# Patient Record
Sex: Female | Born: 1975 | Race: Black or African American | Hispanic: No | Marital: Single | Smoking: Current every day smoker
Health system: Southern US, Community
[De-identification: ages and names within clinical notes are randomized; demographics above are authoritative.]

## PROBLEM LIST (undated history)

## (undated) DIAGNOSIS — F419 Anxiety disorder, unspecified: Secondary | ICD-10-CM

## (undated) HISTORY — PX: NO PAST SURGERIES: SHX2092

---

## 1999-01-24 ENCOUNTER — Encounter: Payer: Self-pay | Admitting: Internal Medicine

## 1999-01-24 ENCOUNTER — Emergency Department (HOSPITAL_COMMUNITY): Admission: EM | Admit: 1999-01-24 | Discharge: 1999-01-24 | Payer: Self-pay | Admitting: Internal Medicine

## 1999-12-29 ENCOUNTER — Emergency Department (HOSPITAL_COMMUNITY): Admission: EM | Admit: 1999-12-29 | Discharge: 1999-12-29 | Payer: Self-pay | Admitting: Emergency Medicine

## 2001-03-24 ENCOUNTER — Inpatient Hospital Stay (HOSPITAL_COMMUNITY): Admission: EM | Admit: 2001-03-24 | Discharge: 2001-03-28 | Payer: Self-pay | Admitting: *Deleted

## 2006-08-12 ENCOUNTER — Emergency Department (HOSPITAL_COMMUNITY): Admission: EM | Admit: 2006-08-12 | Discharge: 2006-08-12 | Payer: Self-pay | Admitting: Emergency Medicine

## 2012-06-10 DIAGNOSIS — F17200 Nicotine dependence, unspecified, uncomplicated: Secondary | ICD-10-CM | POA: Diagnosis not present

## 2012-06-10 DIAGNOSIS — M75100 Unspecified rotator cuff tear or rupture of unspecified shoulder, not specified as traumatic: Secondary | ICD-10-CM | POA: Diagnosis not present

## 2012-06-23 DIAGNOSIS — Z0000 Encounter for general adult medical examination without abnormal findings: Secondary | ICD-10-CM | POA: Diagnosis not present

## 2012-06-23 DIAGNOSIS — Z13220 Encounter for screening for lipoid disorders: Secondary | ICD-10-CM | POA: Diagnosis not present

## 2012-06-23 DIAGNOSIS — Z1231 Encounter for screening mammogram for malignant neoplasm of breast: Secondary | ICD-10-CM | POA: Diagnosis not present

## 2012-06-23 DIAGNOSIS — Z01419 Encounter for gynecological examination (general) (routine) without abnormal findings: Secondary | ICD-10-CM | POA: Diagnosis not present

## 2012-06-23 DIAGNOSIS — R636 Underweight: Secondary | ICD-10-CM | POA: Diagnosis not present

## 2012-06-23 DIAGNOSIS — Z131 Encounter for screening for diabetes mellitus: Secondary | ICD-10-CM | POA: Diagnosis not present

## 2012-06-23 DIAGNOSIS — E040 Nontoxic diffuse goiter: Secondary | ICD-10-CM | POA: Diagnosis not present

## 2012-06-25 DIAGNOSIS — M25819 Other specified joint disorders, unspecified shoulder: Secondary | ICD-10-CM | POA: Diagnosis not present

## 2012-06-25 DIAGNOSIS — M719 Bursopathy, unspecified: Secondary | ICD-10-CM | POA: Diagnosis not present

## 2012-06-25 DIAGNOSIS — M25519 Pain in unspecified shoulder: Secondary | ICD-10-CM | POA: Diagnosis not present

## 2012-12-17 DIAGNOSIS — F17200 Nicotine dependence, unspecified, uncomplicated: Secondary | ICD-10-CM | POA: Diagnosis not present

## 2012-12-17 DIAGNOSIS — R112 Nausea with vomiting, unspecified: Secondary | ICD-10-CM | POA: Diagnosis not present

## 2012-12-17 DIAGNOSIS — R1084 Generalized abdominal pain: Secondary | ICD-10-CM | POA: Diagnosis not present

## 2013-06-14 DIAGNOSIS — F17200 Nicotine dependence, unspecified, uncomplicated: Secondary | ICD-10-CM | POA: Diagnosis not present

## 2013-06-14 DIAGNOSIS — K047 Periapical abscess without sinus: Secondary | ICD-10-CM | POA: Diagnosis not present

## 2014-04-20 DIAGNOSIS — Z681 Body mass index (BMI) 19 or less, adult: Secondary | ICD-10-CM | POA: Diagnosis not present

## 2014-04-20 DIAGNOSIS — J029 Acute pharyngitis, unspecified: Secondary | ICD-10-CM | POA: Diagnosis not present

## 2014-08-23 DIAGNOSIS — R21 Rash and other nonspecific skin eruption: Secondary | ICD-10-CM | POA: Diagnosis not present

## 2014-08-23 DIAGNOSIS — L259 Unspecified contact dermatitis, unspecified cause: Secondary | ICD-10-CM | POA: Diagnosis not present

## 2014-09-16 DIAGNOSIS — R1084 Generalized abdominal pain: Secondary | ICD-10-CM | POA: Diagnosis not present

## 2014-09-16 DIAGNOSIS — K5289 Other specified noninfective gastroenteritis and colitis: Secondary | ICD-10-CM | POA: Diagnosis not present

## 2014-09-16 DIAGNOSIS — F17200 Nicotine dependence, unspecified, uncomplicated: Secondary | ICD-10-CM | POA: Diagnosis not present

## 2014-10-17 DIAGNOSIS — W010XXA Fall on same level from slipping, tripping and stumbling without subsequent striking against object, initial encounter: Secondary | ICD-10-CM | POA: Diagnosis not present

## 2014-10-17 DIAGNOSIS — F17210 Nicotine dependence, cigarettes, uncomplicated: Secondary | ICD-10-CM | POA: Diagnosis not present

## 2014-10-17 DIAGNOSIS — S93601A Unspecified sprain of right foot, initial encounter: Secondary | ICD-10-CM | POA: Diagnosis not present

## 2014-10-17 DIAGNOSIS — S93401A Sprain of unspecified ligament of right ankle, initial encounter: Secondary | ICD-10-CM | POA: Diagnosis not present

## 2014-10-17 DIAGNOSIS — S9000XA Contusion of unspecified ankle, initial encounter: Secondary | ICD-10-CM | POA: Diagnosis not present

## 2014-10-17 DIAGNOSIS — S9031XA Contusion of right foot, initial encounter: Secondary | ICD-10-CM | POA: Diagnosis not present

## 2014-10-20 DIAGNOSIS — F17200 Nicotine dependence, unspecified, uncomplicated: Secondary | ICD-10-CM | POA: Diagnosis not present

## 2014-10-20 DIAGNOSIS — Z681 Body mass index (BMI) 19 or less, adult: Secondary | ICD-10-CM | POA: Diagnosis not present

## 2014-10-20 DIAGNOSIS — S99929A Unspecified injury of unspecified foot, initial encounter: Secondary | ICD-10-CM | POA: Diagnosis not present

## 2015-01-01 DIAGNOSIS — R0989 Other specified symptoms and signs involving the circulatory and respiratory systems: Secondary | ICD-10-CM | POA: Diagnosis not present

## 2015-01-01 DIAGNOSIS — R112 Nausea with vomiting, unspecified: Secondary | ICD-10-CM | POA: Diagnosis not present

## 2015-01-01 DIAGNOSIS — R109 Unspecified abdominal pain: Secondary | ICD-10-CM | POA: Diagnosis not present

## 2015-01-01 DIAGNOSIS — K5900 Constipation, unspecified: Secondary | ICD-10-CM | POA: Diagnosis not present

## 2015-01-01 DIAGNOSIS — F17210 Nicotine dependence, cigarettes, uncomplicated: Secondary | ICD-10-CM | POA: Diagnosis not present

## 2015-01-01 DIAGNOSIS — R1084 Generalized abdominal pain: Secondary | ICD-10-CM | POA: Diagnosis not present

## 2015-01-01 DIAGNOSIS — R509 Fever, unspecified: Secondary | ICD-10-CM | POA: Diagnosis not present

## 2015-01-24 DIAGNOSIS — N951 Menopausal and female climacteric states: Secondary | ICD-10-CM | POA: Diagnosis not present

## 2015-01-24 DIAGNOSIS — Z1329 Encounter for screening for other suspected endocrine disorder: Secondary | ICD-10-CM | POA: Diagnosis not present

## 2015-01-24 DIAGNOSIS — R55 Syncope and collapse: Secondary | ICD-10-CM | POA: Diagnosis not present

## 2015-01-24 DIAGNOSIS — E049 Nontoxic goiter, unspecified: Secondary | ICD-10-CM | POA: Diagnosis not present

## 2015-01-31 DIAGNOSIS — E049 Nontoxic goiter, unspecified: Secondary | ICD-10-CM | POA: Diagnosis not present

## 2015-02-23 DIAGNOSIS — N951 Menopausal and female climacteric states: Secondary | ICD-10-CM | POA: Diagnosis not present

## 2015-02-23 DIAGNOSIS — E042 Nontoxic multinodular goiter: Secondary | ICD-10-CM | POA: Diagnosis not present

## 2015-02-23 DIAGNOSIS — Z23 Encounter for immunization: Secondary | ICD-10-CM | POA: Diagnosis not present

## 2015-02-23 DIAGNOSIS — Z681 Body mass index (BMI) 19 or less, adult: Secondary | ICD-10-CM | POA: Diagnosis not present

## 2015-03-17 DIAGNOSIS — E042 Nontoxic multinodular goiter: Secondary | ICD-10-CM | POA: Diagnosis not present

## 2015-03-17 DIAGNOSIS — E049 Nontoxic goiter, unspecified: Secondary | ICD-10-CM | POA: Diagnosis not present

## 2015-03-31 DIAGNOSIS — E042 Nontoxic multinodular goiter: Secondary | ICD-10-CM | POA: Diagnosis not present

## 2015-04-07 DIAGNOSIS — E042 Nontoxic multinodular goiter: Secondary | ICD-10-CM | POA: Diagnosis not present

## 2015-04-07 DIAGNOSIS — E049 Nontoxic goiter, unspecified: Secondary | ICD-10-CM | POA: Diagnosis not present

## 2015-10-05 DIAGNOSIS — M436 Torticollis: Secondary | ICD-10-CM | POA: Diagnosis not present

## 2016-05-12 DIAGNOSIS — R110 Nausea: Secondary | ICD-10-CM | POA: Diagnosis not present

## 2016-05-12 DIAGNOSIS — K226 Gastro-esophageal laceration-hemorrhage syndrome: Secondary | ICD-10-CM | POA: Diagnosis not present

## 2016-05-12 DIAGNOSIS — K920 Hematemesis: Secondary | ICD-10-CM | POA: Diagnosis not present

## 2016-05-12 DIAGNOSIS — R112 Nausea with vomiting, unspecified: Secondary | ICD-10-CM | POA: Diagnosis not present

## 2016-06-20 DIAGNOSIS — E049 Nontoxic goiter, unspecified: Secondary | ICD-10-CM | POA: Diagnosis not present

## 2016-06-20 DIAGNOSIS — R55 Syncope and collapse: Secondary | ICD-10-CM | POA: Diagnosis not present

## 2016-06-20 DIAGNOSIS — E860 Dehydration: Secondary | ICD-10-CM | POA: Diagnosis not present

## 2016-06-20 DIAGNOSIS — Z1389 Encounter for screening for other disorder: Secondary | ICD-10-CM | POA: Diagnosis not present

## 2016-06-20 DIAGNOSIS — Z0000 Encounter for general adult medical examination without abnormal findings: Secondary | ICD-10-CM | POA: Diagnosis not present

## 2016-06-20 DIAGNOSIS — Z681 Body mass index (BMI) 19 or less, adult: Secondary | ICD-10-CM | POA: Diagnosis not present

## 2016-06-20 DIAGNOSIS — Z9181 History of falling: Secondary | ICD-10-CM | POA: Diagnosis not present

## 2016-06-22 ENCOUNTER — Encounter (HOSPITAL_COMMUNITY): Payer: Self-pay | Admitting: *Deleted

## 2016-06-22 DIAGNOSIS — R55 Syncope and collapse: Secondary | ICD-10-CM | POA: Diagnosis not present

## 2016-06-22 DIAGNOSIS — E860 Dehydration: Secondary | ICD-10-CM | POA: Diagnosis not present

## 2016-06-22 DIAGNOSIS — Z5181 Encounter for therapeutic drug level monitoring: Secondary | ICD-10-CM | POA: Diagnosis not present

## 2016-06-22 NOTE — ED Notes (Signed)
The pt is c/o fainting in the br at work tonight.   Someone at work saw her having a seizure.  No bowel or bladder  Incontinence no tongue damage.  lmp now she saw her doctor Wednesday and family unhappy that more tests were not performed

## 2016-06-23 ENCOUNTER — Emergency Department (HOSPITAL_COMMUNITY)
Admission: EM | Admit: 2016-06-23 | Discharge: 2016-06-23 | Disposition: A | Discharge status: Home / Self Care | Payer: Medicare Other | Attending: Emergency Medicine | Admitting: Emergency Medicine

## 2016-06-23 DIAGNOSIS — R55 Syncope and collapse: Secondary | ICD-10-CM

## 2016-06-23 DIAGNOSIS — E860 Dehydration: Secondary | ICD-10-CM

## 2016-06-23 LAB — URINALYSIS, ROUTINE W REFLEX MICROSCOPIC
Bilirubin Urine: NEGATIVE
Glucose, UA: NEGATIVE mg/dL
KETONES UR: NEGATIVE mg/dL
LEUKOCYTES UA: NEGATIVE
Nitrite: NEGATIVE
PROTEIN: NEGATIVE mg/dL
Specific Gravity, Urine: 1.014 (ref 1.005–1.030)
pH: 6 (ref 5.0–8.0)

## 2016-06-23 LAB — BASIC METABOLIC PANEL
Anion gap: 4 — ABNORMAL LOW (ref 5–15)
CALCIUM: 9 mg/dL (ref 8.9–10.3)
CO2: 26 mmol/L (ref 22–32)
CREATININE: 0.59 mg/dL (ref 0.44–1.00)
Chloride: 107 mmol/L (ref 101–111)
GFR calc non Af Amer: >60 mL/min (ref >60)
Glucose, Bld: 91 mg/dL (ref 65–99)
Potassium: 3.9 mmol/L (ref 3.5–5.1)
SODIUM: 137 mmol/L (ref 135–145)

## 2016-06-23 LAB — RAPID URINE DRUG SCREEN, HOSP PERFORMED
Amphetamines: NOT DETECTED
BARBITURATES: NOT DETECTED
BENZODIAZEPINES: POSITIVE — AB
Cocaine: NOT DETECTED
Opiates: NOT DETECTED
Tetrahydrocannabinol: POSITIVE — AB

## 2016-06-23 LAB — URINE MICROSCOPIC-ADD ON

## 2016-06-23 LAB — CBC
HCT: 34.7 % — ABNORMAL LOW (ref 36.0–46.0)
Hemoglobin: 11.8 g/dL — ABNORMAL LOW (ref 12.0–15.0)
MCH: 26.8 pg (ref 26.0–34.0)
MCHC: 34 g/dL (ref 30.0–36.0)
MCV: 78.7 fL (ref 78.0–100.0)
PLATELETS: 245 10*3/uL (ref 150–400)
RBC: 4.41 MIL/uL (ref 3.87–5.11)
RDW: 16.2 % — AB (ref 11.5–15.5)
WBC: 8.3 10*3/uL (ref 4.0–10.5)

## 2016-06-23 LAB — CBG MONITORING, ED: GLUCOSE-CAPILLARY: 87 mg/dL (ref 65–99)

## 2016-06-23 LAB — TSH: TSH: 1.649 u[IU]/mL (ref 0.350–4.500)

## 2016-06-23 LAB — PREGNANCY, URINE: Preg Test, Ur: NEGATIVE

## 2016-06-23 MED ORDER — SODIUM CHLORIDE 0.9 % IV BOLUS (SEPSIS)
1000.0000 mL | Freq: Once | INTRAVENOUS | Status: AC
  Administered 2016-06-23: 1000 mL via INTRAVENOUS

## 2016-06-23 NOTE — ED Provider Notes (Signed)
CSN: [id number]     Arrival date & time 06/22/16  2337 History   First MD Initiated Contact with Patient 06/23/16 0041     Chief Complaint  Patient presents with  . Loss of Consciousness     (Consider location/radiation/quality/duration/timing/severity/associated sxs/prior Treatment) The history is provided by the patient.     Patient presents following episode of syncope.  Per pt and friends in the room pt has had at least 3 episodes of syncope in the past few weeks.  Pt has felt generalized weakness x several weeks.  Was seen by PCP for same and was found to be very dehydrated.  Pt reportedly does not eat regularly and drinks very little.  She keeps candy with her to eat when she gets too weak.  Today she was walking to the bathroom at work and lost consciousness.  She felt only generalized weakness before this occurred.  She did have mild headache following fall that has resolved.  Friend in room witnessed episode approximately 6 days ago in which the patient was at the grocery store, slumped over the cart and then fell to the floor, still talking and holding onto the shopping cart.  She "jerked" a few times but was awake while this occurred.  She never had episodes of significant shaking or jerking and was never post ictal.   Pt has hx benign thyroid nodules that are being monitored.  Denies any other symptoms.  Takes no medications.   Denies family hx CAD, sudden cardiac or unexplained death.  Denies depression.    PCP Abner GreenspanBeth Hodges, [place]   History reviewed. No pertinent past medical history. History reviewed. No pertinent past surgical history. No family history on file. Social History  Substance Use Topics  . Smoking status: Never Smoker   . Smokeless tobacco: None  . Alcohol Use: No   OB History    No data available     Review of Systems  All other systems reviewed and are negative.     Allergies  Penicillins  Home Medications   Prior to Admission medications   Not  on File   BP 115/73 mmHg  Pulse 72  Temp(Src) 98.4 F (36.9 C) (Oral)  Resp 15  Ht 5\' 7"  (1.702 m)  Wt 51.71 kg  BMI 17.85 kg/m2  SpO2 100%  LMP 06/22/2016 Physical Exam  Constitutional: She appears well-developed. No distress.  thin  HENT:  Head: Normocephalic and atraumatic.  Neck: Neck supple.  Cardiovascular: Normal rate and regular rhythm.   Pulmonary/Chest: Effort normal and breath sounds normal. No respiratory distress. She has no wheezes. She has no rales.  Abdominal: Soft. She exhibits no distension. There is no tenderness. There is no rebound and no guarding.  Neurological: She is alert. She exhibits normal muscle tone.  Skin: She is not diaphoretic.  Psychiatric: She has a normal mood and affect. Her behavior is normal.  Nursing note and vitals reviewed.   ED Course  Procedures (including critical care time) Labs Review Labs Reviewed  BASIC METABOLIC PANEL - Abnormal; Notable for the following:    BUN <5 (*)    Anion gap 4 (*)    All other components within normal limits  CBC - Abnormal; Notable for the following:    Hemoglobin 11.8 (*)    HCT 34.7 (*)    RDW 16.2 (*)    All other components within normal limits  URINALYSIS, ROUTINE W REFLEX MICROSCOPIC (NOT AT [facility]) - Abnormal; Notable for the following:  APPearance CLOUDY (*)    Hgb urine dipstick MODERATE (*)    All other components within normal limits  URINE RAPID DRUG SCREEN, HOSP PERFORMED - Abnormal; Notable for the following:    Benzodiazepines POSITIVE (*)    Tetrahydrocannabinol POSITIVE (*)    All other components within normal limits  URINE MICROSCOPIC-ADD ON - Abnormal; Notable for the following:    Squamous Epithelial / LPF 6-30 (*)    Bacteria, UA FEW (*)    All other components within normal limits  TSH  PREGNANCY, URINE  CBG MONITORING, ED  POC URINE PREG, ED    Imaging Review No results found. I have personally reviewed and evaluated these images and lab results as part of my  medical decision-making.   EKG Interpretation   Date/Time:  Friday June 22 2016 23:51:30 EDT Ventricular Rate:  86 PR Interval:  120 QRS Duration: 78 QT Interval:  376 QTC Calculation: 449 R Axis:   97 Text Interpretation:  Normal sinus rhythm Possible Left atrial enlargement  Rightward axis Borderline ECG No previous ECGs available Confirmed by  Bebe ShaggyWICKLINE  MD, Dorinda HillNALD ([id number]) on 06/23/2016 12:54:29 AM      MDM   Final diagnoses:  Syncope, unspecified syncope type  Dehydration    Afebrile, nontoxic patient with 3 episodes of syncope or near syncope over the past few weeks.  She has been feeling generally weak, has been found to be severely dehydrated by primary care.  She is very thin and reportedly does not eat regularly or drink many fluids during the day.  Workup reassuring.  [place] syncope rules consulted, pt low risk.  Pt feeling much better after IVF, PO trial.   D/C home with close PCP follow up, encouraged PO hydration at home.  Discussed result, findings, treatment, and follow up  with patient.  Pt given return precautions.  Pt verbalizes understanding and agrees with plan.         GlenvilleEmily Braylin Xu, PA-C 06/23/16 [id number]  Zadie Rhineonald Wickline, MD 06/23/16 [phone]

## 2016-06-23 NOTE — Discharge Instructions (Signed)
Read the information below.  You may return to the Emergency Department at any time for worsening condition or any new symptoms that concern you.   Syncope Syncope is a medical term for fainting or passing out. This means you lose consciousness and drop to the ground. People are generally unconscious for less than 5 minutes. You may have some muscle twitches for up to 15 seconds before waking up and returning to normal. Syncope occurs more often in older adults, but it can happen to anyone. While most causes of syncope are not dangerous, syncope can be a sign of a serious medical problem. It is important to seek medical care.  CAUSES  Syncope is caused by a sudden drop in blood flow to the brain. The specific cause is often not determined. Factors that can bring on syncope include:  Taking medicines that lower blood pressure.  Sudden changes in posture, such as standing up quickly.  Taking more medicine than prescribed.  Standing in one place for too long.  Seizure disorders.  Dehydration and excessive exposure to heat.  Low blood sugar (hypoglycemia).  Straining to have a bowel movement.  Heart disease, irregular heartbeat, or other circulatory problems.  Fear, emotional distress, seeing blood, or severe pain. SYMPTOMS  Right before fainting, you may:  Feel dizzy or light-headed.  Feel nauseous.  See all white or all black in your field of vision.  Have cold, clammy skin. DIAGNOSIS  Your health care provider will ask about your symptoms, perform a physical exam, and perform an electrocardiogram (ECG) to record the electrical activity of your heart. Your health care provider may also perform other heart or blood tests to determine the cause of your syncope which may include:  Transthoracic echocardiogram (TTE). During echocardiography, sound waves are used to evaluate how blood flows through your heart.  Transesophageal echocardiogram (TEE).  Cardiac monitoring. This  allows your health care provider to monitor your heart rate and rhythm in real time.  Holter monitor. This is a portable device that records your heartbeat and can help diagnose heart arrhythmias. It allows your health care provider to track your heart activity for several days, if needed.  Stress tests by exercise or by giving medicine that makes the heart beat faster. TREATMENT  In most cases, no treatment is needed. Depending on the cause of your syncope, your health care provider may recommend changing or stopping some of your medicines. HOME CARE INSTRUCTIONS  Have someone stay with you until you feel stable.  Do not drive, use machinery, or play sports until your health care provider says it is okay.  Keep all follow-up appointments as directed by your health care provider.  Lie down right away if you start feeling like you might faint. Breathe deeply and steadily. Wait until all the symptoms have passed.  Drink enough fluids to keep your urine clear or pale yellow.  If you are taking blood pressure or heart medicine, get up slowly and take several minutes to sit and then stand. This can reduce dizziness. SEEK IMMEDIATE MEDICAL CARE IF:   You have a severe headache.  You have unusual pain in the chest, abdomen, or back.  You are bleeding from your mouth or rectum, or you have black or tarry stool.  You have an irregular or very fast heartbeat.  You have pain with breathing.  You have repeated fainting or seizure-like jerking during an episode.  You faint when sitting or lying down.  You have confusion.  You  have trouble walking.  You have severe weakness.  You have vision problems. If you fainted, call your local emergency services (911 in [place] Do not drive yourself to the hospital.    This information is not intended to replace advice given to you by your health care provider. Make sure you discuss any questions you have with your health care provider.     Document Released: 12/10/2005 Document Revised: 04/26/2015 Document Reviewed: 02/08/2012 Elsevier Interactive Patient Education 2016 Elsevier Inc.  Dehydration, Adult Dehydration is a condition in which you do not have enough fluid or water in your body. It happens when you take in less fluid than you lose. Vital organs such as the kidneys, brain, and heart cannot function without a proper amount of fluids. Any loss of fluids from the body can cause dehydration.  Dehydration can range from mild to severe. This condition should be treated right away to help prevent it from becoming severe. CAUSES  This condition may be caused by:  Vomiting.  Diarrhea.  Excessive sweating, such as when exercising in hot or humid weather.  Not drinking enough fluid during strenuous exercise or during an illness.  Excessive urine output.  Fever.  Certain medicines. RISK FACTORS This condition is more likely to develop in:  People who are taking certain medicines that cause the body to lose excess fluid (diuretics).   People who have a chronic illness, such as diabetes, that may increase urination.  Older adults.   People who live at high altitudes.   People who participate in endurance sports.  SYMPTOMS  Mild Dehydration  Thirst.  Dry lips.  Slightly dry mouth.  Dry, warm skin. Moderate Dehydration  Very dry mouth.   Muscle cramps.   Dark urine and decreased urine production.   Decreased tear production.   Headache.   Light-headedness, especially when you stand up from a sitting position.  Severe Dehydration  Changes in skin.   Cold and clammy skin.   Skin does not spring back quickly when lightly pinched and released.   Changes in body fluids.   Extreme thirst.   No tears.   Not able to sweat when body temperature is high, such as in hot weather.   Minimal urine production.   Changes in vital signs.   Rapid, weak pulse (more than 100  beats per minute when you are sitting still).   Rapid breathing.   Low blood pressure.   Other changes.   Sunken eyes.   Cold hands and feet.   Confusion.  Lethargy and difficulty being awakened.  Fainting (syncope).   Short-term weight loss.   Unconsciousness. DIAGNOSIS  This condition may be diagnosed based on your symptoms. You may also have tests to determine how severe your dehydration is. These tests may include:   Urine tests.   Blood tests.  TREATMENT  Treatment for this condition depends on the severity. Mild or moderate dehydration can often be treated at home. Treatment should be started right away. Do not wait until dehydration becomes severe. Severe dehydration needs to be treated at the hospital. Treatment for Mild Dehydration  Drinking plenty of water to replace the fluid you have lost.   Replacing minerals in your blood (electrolytes) that you may have lost.  Treatment for Moderate Dehydration  Consuming oral rehydration solution (ORS). Treatment for Severe Dehydration  Receiving fluid through an IV tube.   Receiving electrolyte solution through a feeding tube that is passed through your nose and into your stomach (nasogastric  tube or NG tube).  Correcting any abnormalities in electrolytes. HOME CARE INSTRUCTIONS   Drink enough fluid to keep your urine clear or pale yellow.   Drink water or fluid slowly by taking small sips. You can also try sucking on ice cubes.  Have food or beverages that contain electrolytes. Examples include bananas and sports drinks.  Take over-the-counter and prescription medicines only as told by your health care provider.   Prepare ORS according to the manufacturer's instructions. Take sips of ORS every 5 minutes until your urine returns to normal.  If you have vomiting or diarrhea, continue to try to drink water, ORS, or both.   If you have diarrhea, avoid:   Beverages that contain caffeine.    Fruit juice.   Milk.   Carbonated soft drinks.  Do not take salt tablets. This can lead to the condition of having too much sodium in your body (hypernatremia).  SEEK MEDICAL CARE IF:  You cannot eat or drink without vomiting.  You have had moderate diarrhea during a period of more than 24 hours.  You have a fever. SEEK IMMEDIATE MEDICAL CARE IF:   You have extreme thirst.  You have severe diarrhea.  You have not urinated in 6-8 hours, or you have urinated only a small amount of very dark urine.  You have shriveled skin.  You are dizzy, confused, or both.   This information is not intended to replace advice given to you by your health care provider. Make sure you discuss any questions you have with your health care provider.   Document Released: 12/10/2005 Document Revised: 08/31/2015 Document Reviewed: 04/27/2015 Elsevier Interactive Patient Education [employer]2016 Elsevier Inc.

## 2016-06-23 NOTE — ED Notes (Signed)
Pt given orange juice and sandwich 

## 2016-10-25 DIAGNOSIS — R110 Nausea: Secondary | ICD-10-CM | POA: Diagnosis not present

## 2016-10-25 DIAGNOSIS — N12 Tubulo-interstitial nephritis, not specified as acute or chronic: Secondary | ICD-10-CM | POA: Diagnosis not present

## 2016-10-25 DIAGNOSIS — Z880 Allergy status to penicillin: Secondary | ICD-10-CM | POA: Diagnosis not present

## 2016-10-25 DIAGNOSIS — Z681 Body mass index (BMI) 19 or less, adult: Secondary | ICD-10-CM | POA: Diagnosis not present

## 2016-10-25 DIAGNOSIS — R109 Unspecified abdominal pain: Secondary | ICD-10-CM | POA: Diagnosis not present

## 2016-10-25 DIAGNOSIS — R6883 Chills (without fever): Secondary | ICD-10-CM | POA: Diagnosis not present

## 2016-10-25 DIAGNOSIS — F17210 Nicotine dependence, cigarettes, uncomplicated: Secondary | ICD-10-CM | POA: Diagnosis not present

## 2016-10-29 ENCOUNTER — Emergency Department (HOSPITAL_COMMUNITY): Payer: Medicare Other

## 2016-10-29 ENCOUNTER — Encounter (HOSPITAL_COMMUNITY): Payer: Self-pay

## 2016-10-29 ENCOUNTER — Emergency Department (HOSPITAL_COMMUNITY)
Admission: EM | Admit: 2016-10-29 | Discharge: 2016-10-29 | Disposition: A | Discharge status: Home / Self Care | Payer: Medicare Other | Attending: Emergency Medicine | Admitting: Emergency Medicine

## 2016-10-29 DIAGNOSIS — R109 Unspecified abdominal pain: Secondary | ICD-10-CM | POA: Diagnosis not present

## 2016-10-29 DIAGNOSIS — Z681 Body mass index (BMI) 19 or less, adult: Secondary | ICD-10-CM | POA: Diagnosis not present

## 2016-10-29 DIAGNOSIS — N12 Tubulo-interstitial nephritis, not specified as acute or chronic: Secondary | ICD-10-CM | POA: Diagnosis not present

## 2016-10-29 DIAGNOSIS — F17200 Nicotine dependence, unspecified, uncomplicated: Secondary | ICD-10-CM | POA: Insufficient documentation

## 2016-10-29 DIAGNOSIS — N939 Abnormal uterine and vaginal bleeding, unspecified: Secondary | ICD-10-CM | POA: Diagnosis not present

## 2016-10-29 DIAGNOSIS — K8020 Calculus of gallbladder without cholecystitis without obstruction: Secondary | ICD-10-CM | POA: Diagnosis not present

## 2016-10-29 DIAGNOSIS — R319 Hematuria, unspecified: Secondary | ICD-10-CM | POA: Diagnosis not present

## 2016-10-29 LAB — URINALYSIS, ROUTINE W REFLEX MICROSCOPIC
Bilirubin Urine: NEGATIVE
Glucose, UA: NEGATIVE mg/dL
KETONES UR: NEGATIVE mg/dL
Nitrite: NEGATIVE
PROTEIN: 30 mg/dL — AB
Specific Gravity, Urine: 1.017 (ref 1.005–1.030)
pH: 7.5 (ref 5.0–8.0)

## 2016-10-29 LAB — URINE MICROSCOPIC-ADD ON

## 2016-10-29 LAB — PREGNANCY, URINE: Preg Test, Ur: NEGATIVE

## 2016-10-29 MED ORDER — SODIUM CHLORIDE 0.9 % IV BOLUS (SEPSIS)
1000.0000 mL | Freq: Once | INTRAVENOUS | Status: AC
  Administered 2016-10-29: 1000 mL via INTRAVENOUS

## 2016-10-29 MED ORDER — KETOROLAC TROMETHAMINE 30 MG/ML IJ SOLN
30.0000 mg | Freq: Once | INTRAMUSCULAR | Status: AC
  Administered 2016-10-29: 30 mg via INTRAVENOUS
  Filled 2016-10-29: qty 1

## 2016-10-29 MED ORDER — ONDANSETRON 4 MG PO TBDP: 4.0000 mg | ORAL_TABLET | Freq: Three times a day (TID) | ORAL | 0 refills | Status: AC | PRN

## 2016-10-29 MED ORDER — HYDROCODONE-ACETAMINOPHEN 5-325 MG PO TABS: 2.0000 | ORAL_TABLET | ORAL | 0 refills | Status: AC | PRN

## 2016-10-29 MED ORDER — DEXTROSE 5 % IV SOLN
1.0000 g | Freq: Once | INTRAVENOUS | Status: AC
  Administered 2016-10-29: 1 g via INTRAVENOUS
  Filled 2016-10-29: qty 10

## 2016-10-29 MED ORDER — CIPROFLOXACIN HCL 500 MG PO TABS: 500.0000 mg | ORAL_TABLET | Freq: Two times a day (BID) | ORAL | 0 refills | Status: AC

## 2016-10-29 NOTE — ED Triage Notes (Signed)
Pt states sent here by PCP for CT scan to rule out kidney stones. Pt complaining of bilateral flank pain. Pt denies any urinary symptoms.

## 2016-10-29 NOTE — Discharge Instructions (Signed)
Follow-up with your primary care physician if not improving.  Start new Rx for Cipro, discontinue your prior antibiotic

## 2016-10-29 NOTE — ED Notes (Signed)
Pt returned from CT °

## 2016-10-29 NOTE — ED Provider Notes (Signed)
[facility]-EMERGENCY DEPT Provider Note   CSN: [id number] Arrival date & time: 10/29/16  1505     History   Chief Complaint Chief Complaint  Patient presents with  . Flank Pain    HPI Shelley Bird is a 40 y.o. female. She presents for evaluation of flank pain. Symptoms started Thursday, 5 days ago. Started with body aches and fever and bilateral flank pain. Seen by her physician and started on an antibiotic and told she had urinary tract infection. She's not improved. Continues having flank pain. Seen and evaluated again by her physician today with worsening flank pain and sent here to rule out kidney stone. No history of stones.  HPI  History reviewed. No pertinent past medical history.  There are no active problems to display for this patient.   History reviewed. No pertinent surgical history.  OB History    No data available       Home Medications    Prior to Admission medications   Medication Sig Start Date End Date Taking? Authorizing Provider  ciprofloxacin (CIPRO) 500 MG tablet Take 1 tablet (500 mg total) by mouth every 12 (twelve) hours. 10/29/16   Rolland Porter, MD  HYDROcodone-acetaminophen (NORCO/VICODIN) 5-325 MG tablet Take 2 tablets by mouth every 4 (four) hours as needed. 10/29/16   Rolland Porter, MD  ondansetron (ZOFRAN ODT) 4 MG disintegrating tablet Take 1 tablet (4 mg total) by mouth every 8 (eight) hours as needed for nausea. 10/29/16   Rolland Porter, MD    Family History History reviewed. No pertinent family history.  Social History Social History  Substance Use Topics  . Smoking status: Current Every Day Smoker  . Smokeless tobacco: Never Used  . Alcohol use No     Allergies   Penicillins   Review of Systems Review of Systems  Constitutional: Positive for fatigue and fever. Negative for appetite change, chills and diaphoresis.  HENT: Negative for mouth sores, sore throat and trouble swallowing.   Eyes: Negative for visual disturbance.    Respiratory: Negative for cough, chest tightness, shortness of breath and wheezing.   Cardiovascular: Negative for chest pain.  Gastrointestinal: Negative for abdominal distention, abdominal pain, diarrhea, nausea and vomiting.  Endocrine: Negative for polydipsia, polyphagia and polyuria.  Genitourinary: Positive for flank pain. Negative for decreased urine volume, dysuria, frequency, hematuria, vaginal bleeding, vaginal discharge and vaginal pain.  Musculoskeletal: Negative for gait problem.  Skin: Negative for color change, pallor and rash.  Neurological: Negative for dizziness, syncope, light-headedness and headaches.  Hematological: Does not bruise/bleed easily.  Psychiatric/Behavioral: Negative for behavioral problems and confusion.     Physical Exam Updated Vital Signs BP 110/65 (BP Location: Left Arm)   Pulse 84   Temp 98.1 F (36.7 C) (Oral)   Resp 17   Ht 5\' 7"  (1.702 m)   Wt 115 lb (52.2 kg)   LMP 10/29/2016   SpO2 100%   BMI 18.01 kg/m   Physical Exam  Constitutional: She is oriented to person, place, and time. She appears well-developed and well-nourished. No distress.  HENT:  Head: Normocephalic.  Eyes: Conjunctivae are normal. Pupils are equal, round, and reactive to light. No scleral icterus.  Neck: Normal range of motion. Neck supple. No thyromegaly present.  Cardiovascular: Normal rate and regular rhythm.  Exam reveals no gallop and no friction rub.   No murmur heard. Pulmonary/Chest: Effort normal and breath sounds normal. No respiratory distress. She has no wheezes. She has no rales.  Abdominal: Soft. Bowel sounds are  normal. She exhibits no distension. There is no tenderness. There is no rebound.  Musculoskeletal: Normal range of motion.       Back:  Diffuse muscle skeletal tenderness. Not specifically isolated to left or right CVA.  Neurological: She is alert and oriented to person, place, and time.  Skin: Skin is warm and dry. No rash noted.   Psychiatric: She has a normal mood and affect. Her behavior is normal.     ED Treatments / Results  Labs (all labs ordered are listed, but only abnormal results are displayed) Labs Reviewed  URINALYSIS, ROUTINE W REFLEX MICROSCOPIC (NOT AT [facility]) - Abnormal; Notable for the following:       Result Value   Hgb urine dipstick SMALL (*)    Protein, ur 30 (*)    Leukocytes, UA SMALL (*)    All other components within normal limits  URINE MICROSCOPIC-ADD ON - Abnormal; Notable for the following:    Squamous Epithelial / LPF 0-5 (*)    Bacteria, UA MANY (*)    All other components within normal limits  PREGNANCY, URINE    EKG  EKG Interpretation None       Radiology Ct Renal Stone Study  Result Date: 10/29/2016 CLINICAL DATA:  Bilateral flank pain and back pain with dysuria for the past 4 days. EXAM: CT ABDOMEN AND PELVIS WITHOUT CONTRAST TECHNIQUE: Multidetector CT imaging of the abdomen and pelvis was performed following the standard protocol without IV contrast. COMPARISON:  None. FINDINGS: Lower chest: Minimal bibasilar dependent atelectasis. Hepatobiliary: Multiple small gallstones in the gallbladder measuring up to 3 mm in maximum diameter each. Poor distention of the gallbladder with minimal diffuse wall thickening. No pericholecystic fluid visualized. Normal appearing liver. Pancreas: Unremarkable. No pancreatic ductal dilatation or surrounding inflammatory changes. Spleen: Normal in size without focal abnormality. Adrenals/Urinary Tract: Adrenal glands are unremarkable. Kidneys are normal, without renal calculi, focal lesion, or hydronephrosis. Bladder is unremarkable. Stomach/Bowel: Stomach is within normal limits. Appendix appears normal. No evidence of bowel wall thickening, distention, or inflammatory changes. Vascular/Lymphatic: No significant vascular findings are present. No enlarged abdominal or pelvic lymph nodes. Reproductive: Large, grossly simple appearing left ovarian  cyst measuring 7.5 cm in maximum diameter. Small amount of free peritoneal fluid in the pelvic cul-de-sac. Normal appearing uterus and right ovary. Other: None. Musculoskeletal: Minimal lumbar and lower thoracic spine degenerative changes. IMPRESSION: 1. Cholelithiasis without evidence of acute cholecystitis. 2. 7.5 cm left ovarian cyst. A elective follow-up pelvic ultrasound is recommended for better characterization. 3. No visible urinary tract abnormality. Electronically Signed   By: Beckie SaltsSteven  Reid M.D.   On: 10/29/2016 19:11    Procedures Procedures (including critical care time)  Medications Ordered in ED Medications  ketorolac (TORADOL) 30 MG/ML injection 30 mg (30 mg Intravenous Given 10/29/16 1808)  cefTRIAXone (ROCEPHIN) 1 g in dextrose 5 % 50 mL IVPB (1 g Intravenous New Bag/Given 10/29/16 1808)  sodium chloride 0.9 % bolus 1,000 mL (1,000 mLs Intravenous New Bag/Given 10/29/16 1808)     Initial Impression / Assessment and Plan / ED Course  I have reviewed the triage vital signs and the nursing notes.  Pertinent labs & imaging results that were available during my care of the patient were reviewed by me and considered in my medical decision making (see chart for details).  Clinical Course     IV placed. Given IV Rocephin. Toradol. Off to CT for stone CT to rule out renal stone.  Final Clinical Impressions(s) / ED Diagnoses  Final diagnoses:  Pyelonephritis    Patient with gallstone. Is not symptomatic with biliary colic symptoms. Has left ovarian cyst. Made aware of this. She does not have specific left pelvic tenderness. Discussed she should follow-up with her primary care physician regarding this. Her symptoms today are consistent with pyelonephritis. We'll change to Cipro. Urine culture added. Plan home, Zofran, Cipro, Vicodin. Primary care follow-up  New Prescriptions New Prescriptions   CIPROFLOXACIN (CIPRO) 500 MG TABLET    Take 1 tablet (500 mg total) by mouth every 12  (twelve) hours.   HYDROCODONE-ACETAMINOPHEN (NORCO/VICODIN) 5-325 MG TABLET    Take 2 tablets by mouth every 4 (four) hours as needed.   ONDANSETRON (ZOFRAN ODT) 4 MG DISINTEGRATING TABLET    Take 1 tablet (4 mg total) by mouth every 8 (eight) hours as needed for nausea.     Rolland PorterMark Mehr Depaoli, MD 10/29/16 [phone]

## 2016-10-29 NOTE — ED Notes (Signed)
Pt taken to CT.

## 2016-11-01 DIAGNOSIS — N390 Urinary tract infection, site not specified: Secondary | ICD-10-CM | POA: Diagnosis not present

## 2016-11-01 DIAGNOSIS — F17200 Nicotine dependence, unspecified, uncomplicated: Secondary | ICD-10-CM | POA: Diagnosis not present

## 2017-01-15 DIAGNOSIS — F5102 Adjustment insomnia: Secondary | ICD-10-CM | POA: Diagnosis not present

## 2017-01-15 DIAGNOSIS — F410 Panic disorder [episodic paroxysmal anxiety] without agoraphobia: Secondary | ICD-10-CM | POA: Diagnosis not present

## 2017-01-15 DIAGNOSIS — F17290 Nicotine dependence, other tobacco product, uncomplicated: Secondary | ICD-10-CM | POA: Diagnosis not present

## 2017-01-15 DIAGNOSIS — Z79899 Other long term (current) drug therapy: Secondary | ICD-10-CM | POA: Diagnosis not present

## 2017-02-20 DIAGNOSIS — Z681 Body mass index (BMI) 19 or less, adult: Secondary | ICD-10-CM | POA: Diagnosis not present

## 2017-02-20 DIAGNOSIS — F419 Anxiety disorder, unspecified: Secondary | ICD-10-CM | POA: Diagnosis not present

## 2017-02-20 DIAGNOSIS — Z87891 Personal history of nicotine dependence: Secondary | ICD-10-CM | POA: Diagnosis not present

## 2017-03-19 DIAGNOSIS — Z681 Body mass index (BMI) 19 or less, adult: Secondary | ICD-10-CM | POA: Diagnosis not present

## 2017-03-19 DIAGNOSIS — F419 Anxiety disorder, unspecified: Secondary | ICD-10-CM | POA: Diagnosis not present

## 2017-03-19 DIAGNOSIS — Z720 Tobacco use: Secondary | ICD-10-CM | POA: Diagnosis not present

## 2017-03-19 DIAGNOSIS — Z716 Tobacco abuse counseling: Secondary | ICD-10-CM | POA: Diagnosis not present

## 2017-04-29 DIAGNOSIS — F410 Panic disorder [episodic paroxysmal anxiety] without agoraphobia: Secondary | ICD-10-CM | POA: Diagnosis not present

## 2017-04-29 DIAGNOSIS — F411 Generalized anxiety disorder: Secondary | ICD-10-CM | POA: Diagnosis not present

## 2017-04-29 DIAGNOSIS — Z681 Body mass index (BMI) 19 or less, adult: Secondary | ICD-10-CM | POA: Diagnosis not present

## 2017-05-28 DIAGNOSIS — Z681 Body mass index (BMI) 19 or less, adult: Secondary | ICD-10-CM | POA: Diagnosis not present

## 2017-05-28 DIAGNOSIS — F411 Generalized anxiety disorder: Secondary | ICD-10-CM | POA: Diagnosis not present

## 2017-05-28 DIAGNOSIS — F410 Panic disorder [episodic paroxysmal anxiety] without agoraphobia: Secondary | ICD-10-CM | POA: Diagnosis not present

## 2017-05-28 DIAGNOSIS — F17200 Nicotine dependence, unspecified, uncomplicated: Secondary | ICD-10-CM | POA: Diagnosis not present

## 2017-06-06 DIAGNOSIS — Z13220 Encounter for screening for lipoid disorders: Secondary | ICD-10-CM | POA: Diagnosis not present

## 2017-06-06 DIAGNOSIS — M79609 Pain in unspecified limb: Secondary | ICD-10-CM | POA: Diagnosis not present

## 2017-06-06 DIAGNOSIS — Z1389 Encounter for screening for other disorder: Secondary | ICD-10-CM | POA: Diagnosis not present

## 2017-06-06 DIAGNOSIS — E042 Nontoxic multinodular goiter: Secondary | ICD-10-CM | POA: Diagnosis not present

## 2017-06-06 DIAGNOSIS — F419 Anxiety disorder, unspecified: Secondary | ICD-10-CM | POA: Diagnosis not present

## 2017-06-06 DIAGNOSIS — F909 Attention-deficit hyperactivity disorder, unspecified type: Secondary | ICD-10-CM | POA: Diagnosis not present

## 2017-06-06 DIAGNOSIS — Z0000 Encounter for general adult medical examination without abnormal findings: Secondary | ICD-10-CM | POA: Diagnosis not present

## 2017-06-06 DIAGNOSIS — Z131 Encounter for screening for diabetes mellitus: Secondary | ICD-10-CM | POA: Diagnosis not present

## 2017-06-06 DIAGNOSIS — G43909 Migraine, unspecified, not intractable, without status migrainosus: Secondary | ICD-10-CM | POA: Diagnosis not present

## 2017-06-06 DIAGNOSIS — Z139 Encounter for screening, unspecified: Secondary | ICD-10-CM | POA: Diagnosis not present

## 2017-06-06 DIAGNOSIS — Z01419 Encounter for gynecological examination (general) (routine) without abnormal findings: Secondary | ICD-10-CM | POA: Diagnosis not present

## 2017-06-06 DIAGNOSIS — F3160 Bipolar disorder, current episode mixed, unspecified: Secondary | ICD-10-CM | POA: Diagnosis not present

## 2017-06-12 DIAGNOSIS — M549 Dorsalgia, unspecified: Secondary | ICD-10-CM | POA: Diagnosis not present

## 2017-06-12 DIAGNOSIS — F17210 Nicotine dependence, cigarettes, uncomplicated: Secondary | ICD-10-CM | POA: Diagnosis not present

## 2017-07-31 DIAGNOSIS — Z1389 Encounter for screening for other disorder: Secondary | ICD-10-CM | POA: Diagnosis not present

## 2017-07-31 DIAGNOSIS — Z0000 Encounter for general adult medical examination without abnormal findings: Secondary | ICD-10-CM | POA: Diagnosis not present

## 2017-07-31 DIAGNOSIS — Z139 Encounter for screening, unspecified: Secondary | ICD-10-CM | POA: Diagnosis not present

## 2018-04-21 DIAGNOSIS — R636 Underweight: Secondary | ICD-10-CM | POA: Diagnosis not present

## 2018-04-21 DIAGNOSIS — N911 Secondary amenorrhea: Secondary | ICD-10-CM | POA: Diagnosis not present

## 2018-04-21 DIAGNOSIS — Z681 Body mass index (BMI) 19 or less, adult: Secondary | ICD-10-CM | POA: Diagnosis not present

## 2018-05-16 DIAGNOSIS — Z3201 Encounter for pregnancy test, result positive: Secondary | ICD-10-CM | POA: Diagnosis not present

## 2018-05-16 DIAGNOSIS — Z3689 Encounter for other specified antenatal screening: Secondary | ICD-10-CM | POA: Diagnosis not present

## 2018-05-16 DIAGNOSIS — Z3A08 8 weeks gestation of pregnancy: Secondary | ICD-10-CM | POA: Diagnosis not present

## 2018-05-21 DIAGNOSIS — O3680X0 Pregnancy with inconclusive fetal viability, not applicable or unspecified: Secondary | ICD-10-CM | POA: Diagnosis not present

## 2018-05-21 DIAGNOSIS — Z3A09 9 weeks gestation of pregnancy: Secondary | ICD-10-CM | POA: Diagnosis not present

## 2018-05-21 DIAGNOSIS — Z3689 Encounter for other specified antenatal screening: Secondary | ICD-10-CM | POA: Diagnosis not present

## 2018-06-18 DIAGNOSIS — Z3A12 12 weeks gestation of pregnancy: Secondary | ICD-10-CM | POA: Diagnosis not present

## 2018-06-18 DIAGNOSIS — Z3A13 13 weeks gestation of pregnancy: Secondary | ICD-10-CM | POA: Diagnosis not present

## 2018-06-18 DIAGNOSIS — Z3682 Encounter for antenatal screening for nuchal translucency: Secondary | ICD-10-CM | POA: Diagnosis not present

## 2018-07-09 DIAGNOSIS — O26892 Other specified pregnancy related conditions, second trimester: Secondary | ICD-10-CM | POA: Diagnosis not present

## 2018-07-09 DIAGNOSIS — O09512 Supervision of elderly primigravida, second trimester: Secondary | ICD-10-CM | POA: Diagnosis not present

## 2018-07-09 DIAGNOSIS — O99332 Smoking (tobacco) complicating pregnancy, second trimester: Secondary | ICD-10-CM | POA: Diagnosis not present

## 2018-07-09 DIAGNOSIS — F17210 Nicotine dependence, cigarettes, uncomplicated: Secondary | ICD-10-CM | POA: Diagnosis not present

## 2018-07-09 DIAGNOSIS — R319 Hematuria, unspecified: Secondary | ICD-10-CM | POA: Diagnosis not present

## 2018-07-09 DIAGNOSIS — R1030 Lower abdominal pain, unspecified: Secondary | ICD-10-CM | POA: Diagnosis not present

## 2018-07-09 DIAGNOSIS — Z3A16 16 weeks gestation of pregnancy: Secondary | ICD-10-CM | POA: Diagnosis not present

## 2018-07-11 DIAGNOSIS — Z1379 Encounter for other screening for genetic and chromosomal anomalies: Secondary | ICD-10-CM | POA: Diagnosis not present

## 2018-07-11 DIAGNOSIS — Z3A18 18 weeks gestation of pregnancy: Secondary | ICD-10-CM | POA: Diagnosis not present

## 2018-07-25 ENCOUNTER — Encounter (HOSPITAL_COMMUNITY): Payer: Self-pay

## 2018-07-25 DIAGNOSIS — Z3A18 18 weeks gestation of pregnancy: Secondary | ICD-10-CM | POA: Diagnosis not present

## 2018-07-25 DIAGNOSIS — Z3686 Encounter for antenatal screening for cervical length: Secondary | ICD-10-CM | POA: Diagnosis not present

## 2018-07-25 DIAGNOSIS — O26872 Cervical shortening, second trimester: Secondary | ICD-10-CM | POA: Diagnosis not present

## 2018-07-25 DIAGNOSIS — Z3689 Encounter for other specified antenatal screening: Secondary | ICD-10-CM | POA: Diagnosis not present

## 2018-07-28 ENCOUNTER — Other Ambulatory Visit (HOSPITAL_COMMUNITY): Payer: Self-pay | Admitting: Obstetrics and Gynecology

## 2018-07-28 DIAGNOSIS — Z3689 Encounter for other specified antenatal screening: Secondary | ICD-10-CM

## 2018-07-28 DIAGNOSIS — Z3A20 20 weeks gestation of pregnancy: Secondary | ICD-10-CM

## 2018-07-28 DIAGNOSIS — O26879 Cervical shortening, unspecified trimester: Secondary | ICD-10-CM

## 2018-08-17 DIAGNOSIS — O26892 Other specified pregnancy related conditions, second trimester: Secondary | ICD-10-CM | POA: Diagnosis not present

## 2018-08-17 DIAGNOSIS — O09512 Supervision of elderly primigravida, second trimester: Secondary | ICD-10-CM | POA: Diagnosis not present

## 2018-08-17 DIAGNOSIS — Z3A21 21 weeks gestation of pregnancy: Secondary | ICD-10-CM | POA: Diagnosis not present

## 2018-08-17 DIAGNOSIS — R109 Unspecified abdominal pain: Secondary | ICD-10-CM | POA: Diagnosis not present

## 2018-08-19 ENCOUNTER — Other Ambulatory Visit: Payer: Self-pay

## 2018-08-19 ENCOUNTER — Other Ambulatory Visit (HOSPITAL_COMMUNITY): Payer: Self-pay | Admitting: *Deleted

## 2018-08-19 ENCOUNTER — Encounter (HOSPITAL_COMMUNITY): Payer: Self-pay | Admitting: *Deleted

## 2018-08-19 ENCOUNTER — Other Ambulatory Visit (HOSPITAL_COMMUNITY): Payer: Self-pay | Admitting: Obstetrics and Gynecology

## 2018-08-19 ENCOUNTER — Ambulatory Visit (HOSPITAL_COMMUNITY)
Admission: RE | Admit: 2018-08-19 | Discharge: 2018-08-19 | Disposition: A | Discharge status: Home / Self Care | Payer: Medicare Other | Source: Ambulatory Visit | Attending: Obstetrics and Gynecology | Admitting: Obstetrics and Gynecology

## 2018-08-19 DIAGNOSIS — O09512 Supervision of elderly primigravida, second trimester: Secondary | ICD-10-CM | POA: Diagnosis not present

## 2018-08-19 DIAGNOSIS — O99332 Smoking (tobacco) complicating pregnancy, second trimester: Secondary | ICD-10-CM

## 2018-08-19 DIAGNOSIS — Z3689 Encounter for other specified antenatal screening: Secondary | ICD-10-CM | POA: Diagnosis not present

## 2018-08-19 DIAGNOSIS — F17210 Nicotine dependence, cigarettes, uncomplicated: Secondary | ICD-10-CM | POA: Insufficient documentation

## 2018-08-19 DIAGNOSIS — Z363 Encounter for antenatal screening for malformations: Secondary | ICD-10-CM

## 2018-08-19 DIAGNOSIS — O26872 Cervical shortening, second trimester: Secondary | ICD-10-CM | POA: Diagnosis not present

## 2018-08-19 DIAGNOSIS — O26879 Cervical shortening, unspecified trimester: Secondary | ICD-10-CM

## 2018-08-19 DIAGNOSIS — Z3A20 20 weeks gestation of pregnancy: Secondary | ICD-10-CM

## 2018-08-19 DIAGNOSIS — Z3A22 22 weeks gestation of pregnancy: Secondary | ICD-10-CM | POA: Insufficient documentation

## 2018-08-19 DIAGNOSIS — Z362 Encounter for other antenatal screening follow-up: Secondary | ICD-10-CM

## 2018-08-19 HISTORY — DX: Anxiety disorder, unspecified: F41.9

## 2018-09-16 ENCOUNTER — Ambulatory Visit (HOSPITAL_COMMUNITY)
Admission: RE | Admit: 2018-09-16 | Discharge: 2018-09-16 | Disposition: A | Discharge status: Home / Self Care | Payer: Medicare Other | Source: Ambulatory Visit | Attending: Obstetrics and Gynecology | Admitting: Obstetrics and Gynecology

## 2018-09-16 DIAGNOSIS — Z362 Encounter for other antenatal screening follow-up: Secondary | ICD-10-CM

## 2018-09-18 DIAGNOSIS — Z3A26 26 weeks gestation of pregnancy: Secondary | ICD-10-CM | POA: Diagnosis not present

## 2018-09-18 DIAGNOSIS — O26873 Cervical shortening, third trimester: Secondary | ICD-10-CM | POA: Diagnosis not present

## 2018-09-18 DIAGNOSIS — O4703 False labor before 37 completed weeks of gestation, third trimester: Secondary | ICD-10-CM | POA: Diagnosis not present

## 2018-09-19 DIAGNOSIS — O26893 Other specified pregnancy related conditions, third trimester: Secondary | ICD-10-CM | POA: Diagnosis not present

## 2018-09-25 ENCOUNTER — Encounter (HOSPITAL_COMMUNITY): Payer: Self-pay

## 2018-09-25 ENCOUNTER — Ambulatory Visit (HOSPITAL_COMMUNITY)
Admission: RE | Admit: 2018-09-25 | Discharge: 2018-09-25 | Disposition: A | Discharge status: Home / Self Care | Payer: Medicare Other | Source: Ambulatory Visit | Attending: Obstetrics and Gynecology | Admitting: Obstetrics and Gynecology

## 2018-09-25 DIAGNOSIS — Z362 Encounter for other antenatal screening follow-up: Secondary | ICD-10-CM | POA: Diagnosis not present

## 2018-09-25 DIAGNOSIS — Z3A27 27 weeks gestation of pregnancy: Secondary | ICD-10-CM | POA: Diagnosis not present

## 2018-09-25 DIAGNOSIS — O09512 Supervision of elderly primigravida, second trimester: Secondary | ICD-10-CM

## 2018-09-25 DIAGNOSIS — O99332 Smoking (tobacco) complicating pregnancy, second trimester: Secondary | ICD-10-CM | POA: Diagnosis not present

## 2018-10-03 DIAGNOSIS — Z3490 Encounter for supervision of normal pregnancy, unspecified, unspecified trimester: Secondary | ICD-10-CM | POA: Diagnosis not present

## 2018-10-03 DIAGNOSIS — Z23 Encounter for immunization: Secondary | ICD-10-CM | POA: Diagnosis not present

## 2018-10-03 DIAGNOSIS — R7302 Impaired glucose tolerance (oral): Secondary | ICD-10-CM | POA: Diagnosis not present

## 2018-10-03 DIAGNOSIS — O99810 Abnormal glucose complicating pregnancy: Secondary | ICD-10-CM | POA: Diagnosis not present

## 2018-10-03 DIAGNOSIS — Z3A28 28 weeks gestation of pregnancy: Secondary | ICD-10-CM | POA: Diagnosis not present

## 2018-10-08 DIAGNOSIS — R7302 Impaired glucose tolerance (oral): Secondary | ICD-10-CM | POA: Diagnosis not present

## 2018-10-17 DIAGNOSIS — O26899 Other specified pregnancy related conditions, unspecified trimester: Secondary | ICD-10-CM | POA: Diagnosis not present

## 2018-10-17 DIAGNOSIS — R109 Unspecified abdominal pain: Secondary | ICD-10-CM | POA: Diagnosis not present

## 2018-11-07 DIAGNOSIS — O26843 Uterine size-date discrepancy, third trimester: Secondary | ICD-10-CM | POA: Diagnosis not present

## 2018-11-07 DIAGNOSIS — Z3A33 33 weeks gestation of pregnancy: Secondary | ICD-10-CM | POA: Diagnosis not present

## 2018-11-19 DIAGNOSIS — Z3A34 34 weeks gestation of pregnancy: Secondary | ICD-10-CM | POA: Diagnosis not present

## 2018-11-27 DIAGNOSIS — Z3A36 36 weeks gestation of pregnancy: Secondary | ICD-10-CM | POA: Diagnosis not present

## 2018-12-02 DIAGNOSIS — O321XX0 Maternal care for breech presentation, not applicable or unspecified: Secondary | ICD-10-CM | POA: Diagnosis present

## 2018-12-02 DIAGNOSIS — O411290 Chorioamnionitis, unspecified trimester, not applicable or unspecified: Secondary | ICD-10-CM | POA: Diagnosis not present

## 2018-12-02 DIAGNOSIS — F17210 Nicotine dependence, cigarettes, uncomplicated: Secondary | ICD-10-CM | POA: Diagnosis present

## 2018-12-02 DIAGNOSIS — Z3A37 37 weeks gestation of pregnancy: Secondary | ICD-10-CM | POA: Diagnosis not present

## 2018-12-02 DIAGNOSIS — O99334 Smoking (tobacco) complicating childbirth: Secondary | ICD-10-CM | POA: Diagnosis present

## 2018-12-02 DIAGNOSIS — Z3A00 Weeks of gestation of pregnancy not specified: Secondary | ICD-10-CM | POA: Diagnosis not present

## 2019-03-17 ENCOUNTER — Encounter (HOSPITAL_COMMUNITY): Payer: Self-pay

## 2019-07-15 DIAGNOSIS — R197 Diarrhea, unspecified: Secondary | ICD-10-CM | POA: Diagnosis not present

## 2019-07-15 DIAGNOSIS — R51 Headache: Secondary | ICD-10-CM | POA: Diagnosis not present

## 2019-10-20 DIAGNOSIS — N3001 Acute cystitis with hematuria: Secondary | ICD-10-CM | POA: Diagnosis not present

## 2019-10-20 DIAGNOSIS — B9689 Other specified bacterial agents as the cause of diseases classified elsewhere: Secondary | ICD-10-CM | POA: Diagnosis not present

## 2019-12-04 IMAGING — US US MFM OB FOLLOW-UP
1 series · 14 of 28 positions shown · non-contrast
Comparison: none

[Series 1: us mfm ob follow-up · 14 of 38 slices shown]
[im 2/38]
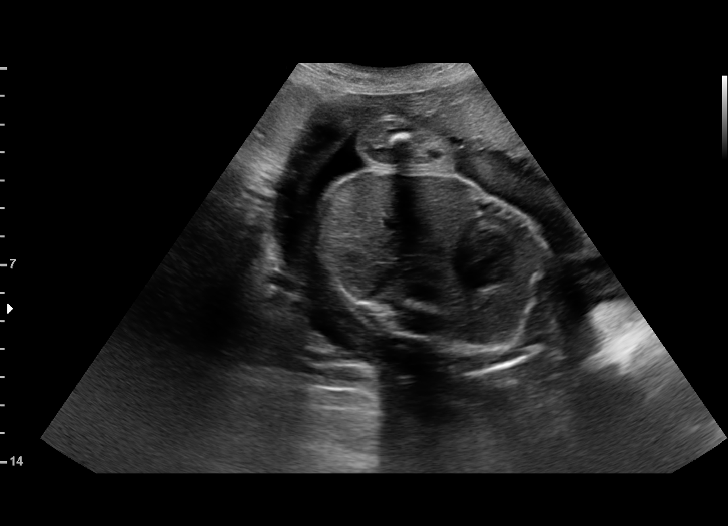
[im 5/38]
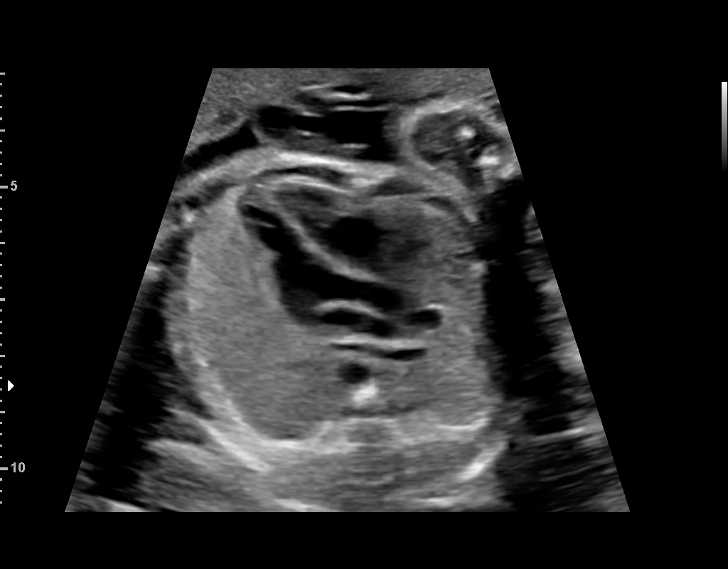
[im 7/38]
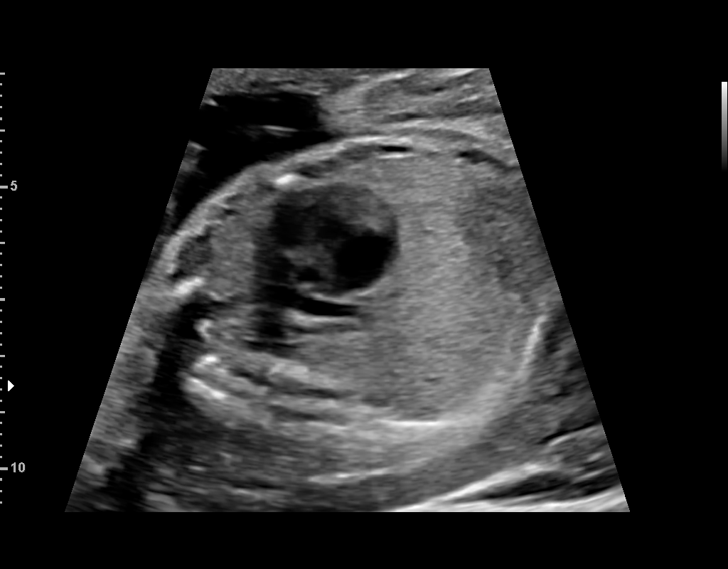
[im 10/38]
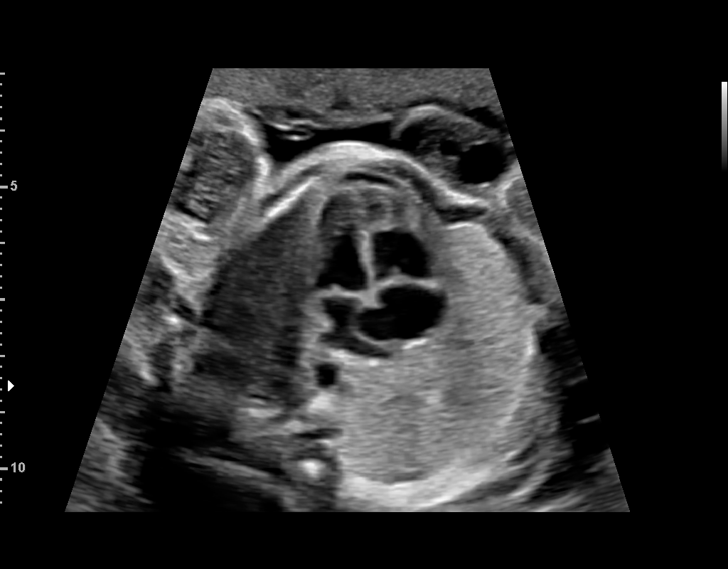
[im 13/38]
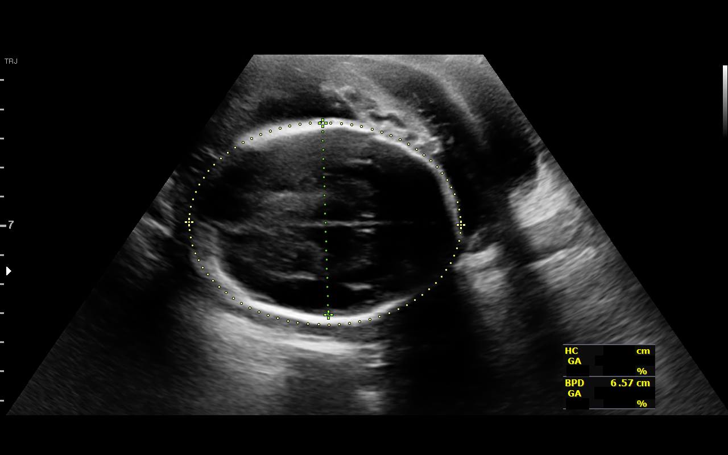
[im 16/38]
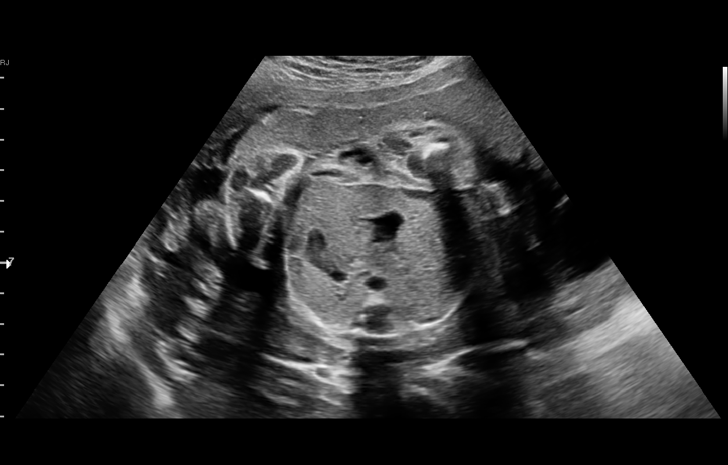
[im 18/38]
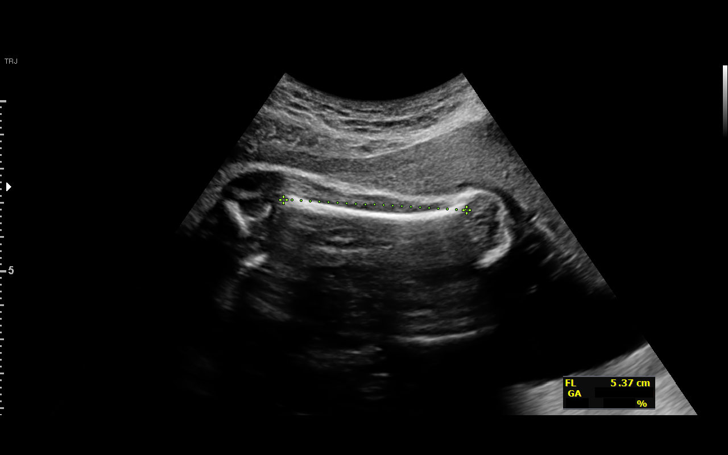
[im 21/38]
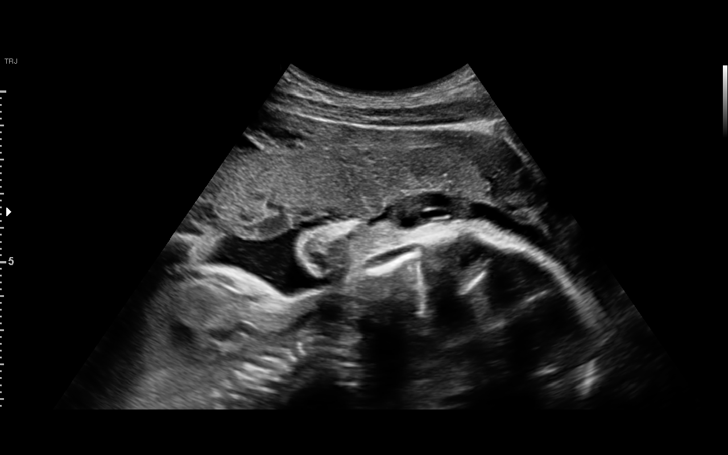
[im 24/38]
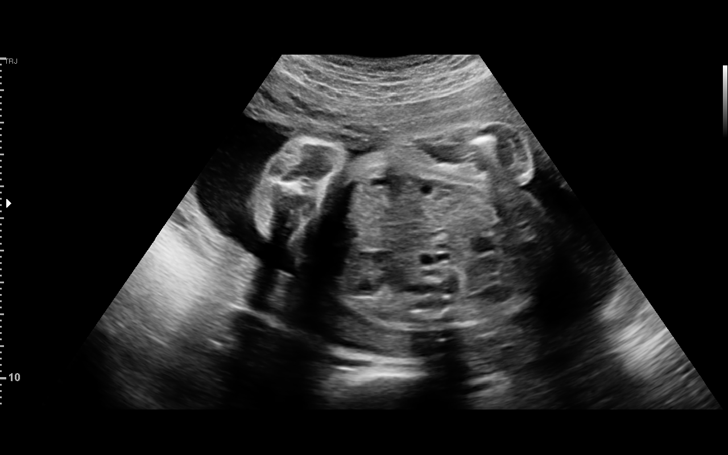
[im 27/38]
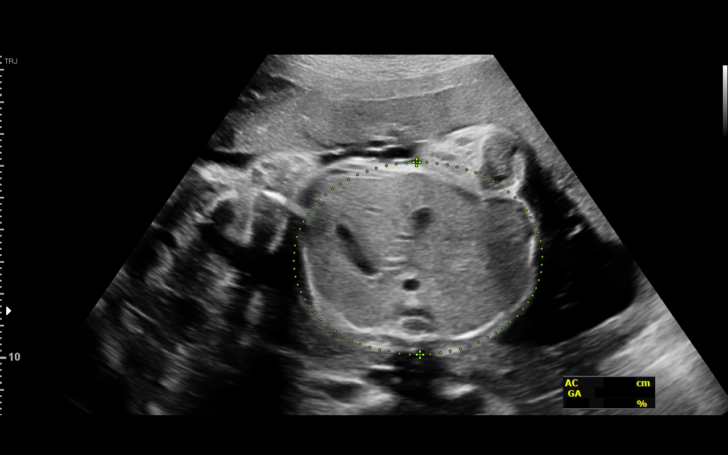
[im 29/38]
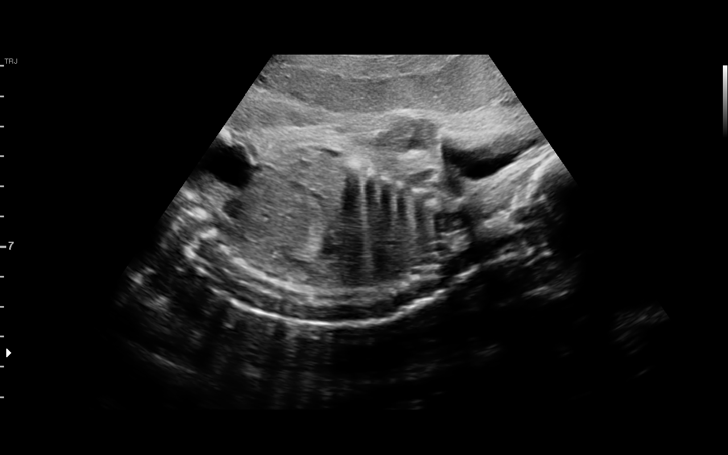
[im 32/38]
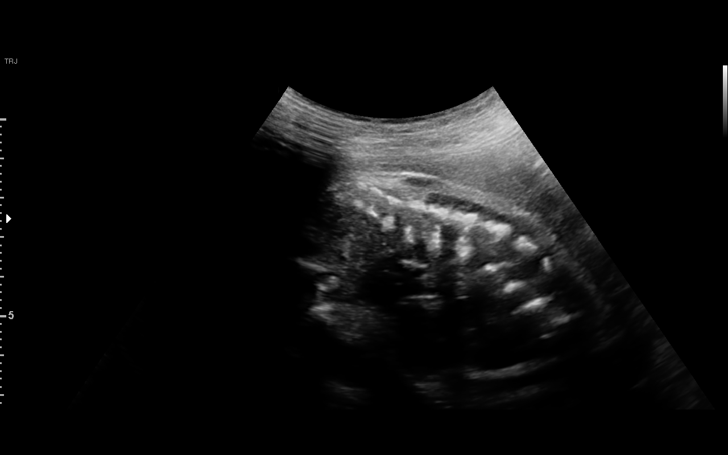
[im 35/38]
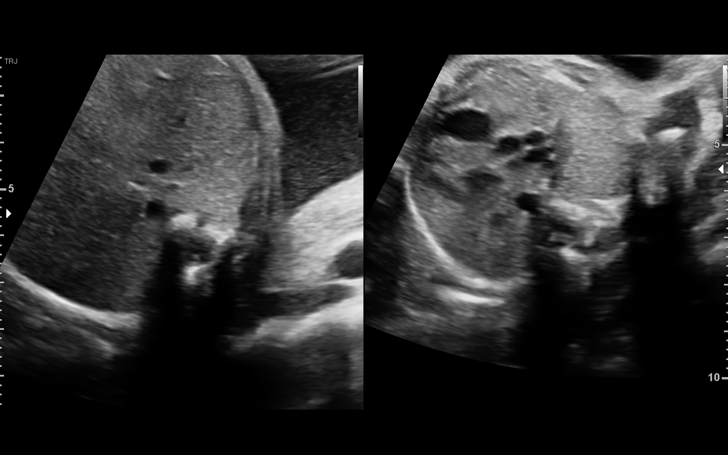
[im 38/38]
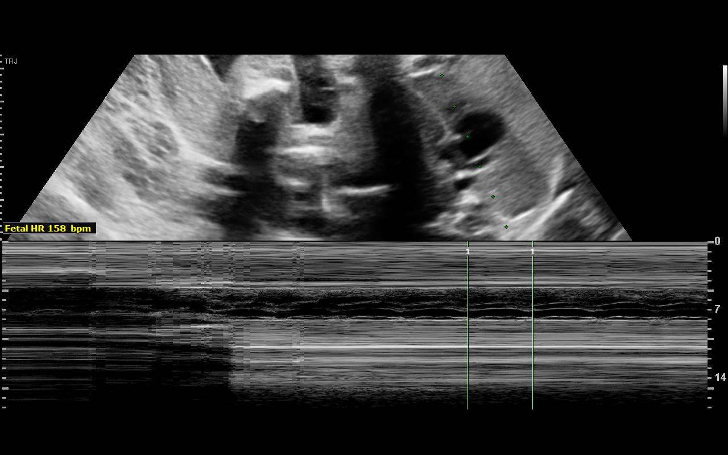

[14 of 28 positions shown; findings below may reference images not displayed]

Indications

Encounter for other antenatal screening
follow-up
Advanced maternal age primigravida 35+,
second trimester (1st Tri screeing, neg; AFP,
neg)
Smoking complicating pregnancy, second
trimester (1pk/3 days)
27 weeks gestation of pregnancy
Fetal Evaluation

Num Of Fetuses:         1
Fetal Heart Rate(bpm):  158
Cardiac Activity:       Observed
Presentation:           Cephalic
Placenta:               Anterior

Amniotic Fluid
AFI FV:      Within normal limits

Largest Pocket(cm)
4.78
Biometry

BPD:        66  mm     G. Age:  26w 4d         19  %    CI:        68.81   %    70 - 86
FL/HC:      20.7   %    18.6 -
HC:      254.2  mm     G. Age:  27w 4d         32  %    HC/AC:      1.17        1.05 -
AC:      217.6  mm     G. Age:  26w 2d         15  %    FL/BPD:     79.5   %    71 - 87
FL:       52.5  mm     G. Age:  28w 0d         55  %    FL/AC:      24.1   %    20 - 24

LV:        5.5  mm

Est. FW:    6964  gm      2 lb 4 oz     46  %
OB History

Gravidity:    1
Gestational Age

LMP:           27w 2d        Date:  03/18/18                 EDD:   12/23/18
U/S Today:     27w 1d                                        EDD:   12/24/18
Best:          27w 2d     Det. By:  LMP  (03/18/18)          EDD:   12/23/18
Anatomy

Cranium:               Appears normal         Ductal Arch:            Appears normal
Cavum:                 Appears normal         Diaphragm:              Previously seen
Ventricles:            Appears normal         Stomach:                Appears normal, left
sided
Choroid Plexus:        Previously seen        Abdomen:                Previously seen
Cerebellum:            Previously seen        Abdominal Wall:         Appears nml (cord
insert, abd wall)
Posterior Fossa:       Previously seen        Cord Vessels:           Previously seen
Face:                  Appears normal         Kidneys:                Appear normal
(orbits and profile)
Lips:                  Previously seen        Bladder:                Appears normal
Heart:                 Appears normal         Spine:                  Limited views
(4CH, axis, and situs                          appear normal
RVOT:                  Appears normal         Upper Extremities:      Previously seen
LVOT:                  Appears normal         Lower Extremities:      Previously seen
Aortic Arch:           Previously seen
Impression

Patient returned for completion of fetal anatomy.
Amniotic fluid is normal and good fetal activity is seen. Fetal
growth is appropriate for gestational age. RVOT, ductal arch
and spine appear normal.
Patient takes vaginal progesterone. We did not perform
transvaginal ultrasound as the patient did not have symptoms
of uterine contractions.
Recommendations

Follow-up scans as clinically indicated.

## 2020-04-14 DIAGNOSIS — J029 Acute pharyngitis, unspecified: Secondary | ICD-10-CM | POA: Diagnosis not present

## 2020-05-17 DIAGNOSIS — Z136 Encounter for screening for cardiovascular disorders: Secondary | ICD-10-CM | POA: Diagnosis not present

## 2020-05-17 DIAGNOSIS — Z0000 Encounter for general adult medical examination without abnormal findings: Secondary | ICD-10-CM | POA: Diagnosis not present

## 2020-05-17 DIAGNOSIS — F17210 Nicotine dependence, cigarettes, uncomplicated: Secondary | ICD-10-CM | POA: Diagnosis not present

## 2020-05-17 DIAGNOSIS — Z7189 Other specified counseling: Secondary | ICD-10-CM | POA: Diagnosis not present

## 2020-05-17 DIAGNOSIS — Z139 Encounter for screening, unspecified: Secondary | ICD-10-CM | POA: Diagnosis not present

## 2020-05-17 DIAGNOSIS — Z681 Body mass index (BMI) 19 or less, adult: Secondary | ICD-10-CM | POA: Diagnosis not present

## 2020-07-20 ENCOUNTER — Emergency Department (HOSPITAL_COMMUNITY)
Admission: EM | Admit: 2020-07-20 | Discharge: 2020-07-20 | Disposition: A | Discharge status: Home / Self Care | Payer: Medicare Other | Attending: Emergency Medicine | Admitting: Emergency Medicine

## 2020-07-20 DIAGNOSIS — Z5321 Procedure and treatment not carried out due to patient leaving prior to being seen by health care provider: Secondary | ICD-10-CM | POA: Insufficient documentation

## 2020-07-20 DIAGNOSIS — R079 Chest pain, unspecified: Secondary | ICD-10-CM | POA: Diagnosis not present

## 2020-07-20 LAB — BASIC METABOLIC PANEL
Anion gap: 7 (ref 5–15)
BUN: <5 mg/dL — ABNORMAL LOW (ref 6–20)
CO2: 23 mmol/L (ref 22–32)
Calcium: 8.5 mg/dL — ABNORMAL LOW (ref 8.9–10.3)
Chloride: 106 mmol/L (ref 98–111)
Creatinine, Ser: 0.68 mg/dL (ref 0.44–1.00)
GFR calc Af Amer: >60 mL/min (ref >60)
GFR calc non Af Amer: >60 mL/min (ref >60)
Glucose, Bld: 84 mg/dL (ref 70–99)
Potassium: 3.7 mmol/L (ref 3.5–5.1)
Sodium: 136 mmol/L (ref 135–145)

## 2020-07-20 LAB — I-STAT BETA HCG BLOOD, ED (MC, WL, AP ONLY): I-stat hCG, quantitative: <5 m[IU]/mL (ref <5)

## 2020-07-20 LAB — CBC
HCT: 38.3 % (ref 36.0–46.0)
Hemoglobin: 13 g/dL (ref 12.0–15.0)
MCH: 27.1 pg (ref 26.0–34.0)
MCHC: 33.9 g/dL (ref 30.0–36.0)
MCV: 80 fL (ref 80.0–100.0)
Platelets: 254 10*3/uL (ref 150–400)
RBC: 4.79 MIL/uL (ref 3.87–5.11)
RDW: 15 % (ref 11.5–15.5)
WBC: 8.6 10*3/uL (ref 4.0–10.5)
nRBC: 0 % (ref 0.0–0.2)

## 2020-07-20 MED ORDER — SODIUM CHLORIDE 0.9% FLUSH: 3.0000 mL | Freq: Once | INTRAVENOUS | Status: DC

## 2020-07-20 NOTE — ED Triage Notes (Signed)
To ED for eval of chest pain since this am. States pain feels sharp and constant - worse with any movement or deep breath. No nausea. No vomiting. No fevers. Appears in nad. Speaks in full clear sentences.

## 2020-07-20 NOTE — ED Notes (Signed)
Called for x-ray no answer and called for reassess vitals no answer X1

## 2020-07-20 NOTE — ED Notes (Signed)
Called pt for repeat vitals no answer and I do not see pt in lobby

## 2020-08-08 DIAGNOSIS — R079 Chest pain, unspecified: Secondary | ICD-10-CM | POA: Diagnosis not present

## 2020-08-08 DIAGNOSIS — R0789 Other chest pain: Secondary | ICD-10-CM | POA: Diagnosis not present

## 2020-08-08 DIAGNOSIS — Z743 Need for continuous supervision: Secondary | ICD-10-CM | POA: Diagnosis not present

## 2020-08-08 DIAGNOSIS — J432 Centrilobular emphysema: Secondary | ICD-10-CM | POA: Diagnosis not present

## 2020-08-08 DIAGNOSIS — M25512 Pain in left shoulder: Secondary | ICD-10-CM | POA: Diagnosis not present

## 2020-09-21 DIAGNOSIS — Z20822 Contact with and (suspected) exposure to covid-19: Secondary | ICD-10-CM | POA: Diagnosis not present

## 2020-09-21 DIAGNOSIS — R509 Fever, unspecified: Secondary | ICD-10-CM | POA: Diagnosis not present

## 2021-05-18 ENCOUNTER — Telehealth: Payer: Self-pay | Admitting: Cardiology

## 2021-05-18 NOTE — Telephone Encounter (Signed)
Received direct call to DOD Pod from [place] at [facility] who states that they have this patient who is actively having chest pain and requesting to speak with DOD to get patient seen today. Advised [place] that if patient is currently having chest pain she should report directly to the Emergency Department for emergent evaluation. [place] states that patients chest pain responded to Nitroglycerin. Made Dr. Antoine Poche aware, per Dr. Antoine Poche patient should report to the ER for evaluation. [place] verbalized understanding.

## 2021-05-18 NOTE — Telephone Encounter (Signed)
[place] from [facility] calling with an urgent referral requesting patient be seen today or Dr. Leretha Pol to speak with the DOD. Patient is having active chest pain in their office now.
